# Patient Record
Sex: Male | Born: 1965 | Race: Black or African American | Hispanic: No | Marital: Married | Smoking: Never smoker
Health system: Southern US, Community
[De-identification: ages and names within clinical notes are randomized; demographics above are authoritative.]

## PROBLEM LIST (undated history)

## (undated) HISTORY — PX: INGUINAL HERNIA REPAIR: SHX194

---

## 2002-04-26 ENCOUNTER — Emergency Department (HOSPITAL_COMMUNITY): Admission: EM | Admit: 2002-04-26 | Discharge: 2002-04-26 | Payer: Self-pay | Admitting: Emergency Medicine

## 2002-04-26 ENCOUNTER — Encounter: Payer: Self-pay | Admitting: Emergency Medicine

## 2003-09-11 ENCOUNTER — Emergency Department (HOSPITAL_COMMUNITY): Admission: EM | Admit: 2003-09-11 | Discharge: 2003-09-11 | Payer: Self-pay | Admitting: Emergency Medicine

## 2005-04-09 IMAGING — CT CT HEAD W/O CM
1 of 2 series · 13 of 30 positions shown, 17 images · non-contrast
Comparison: to 04/26/02.

CLINICAL DATA: Syncope, headache, dizziness
CT HEAD W/O CONTRAST
TECHNIQUE: Multi-detector helical CT scanning is obtained from the skull base to the vertex.

[Series 2: brain · axial · 0.47mm/px · z∈[+139,+259]mm · 13 of 28 slices shown, 17 images]
[im 2/28  brain]
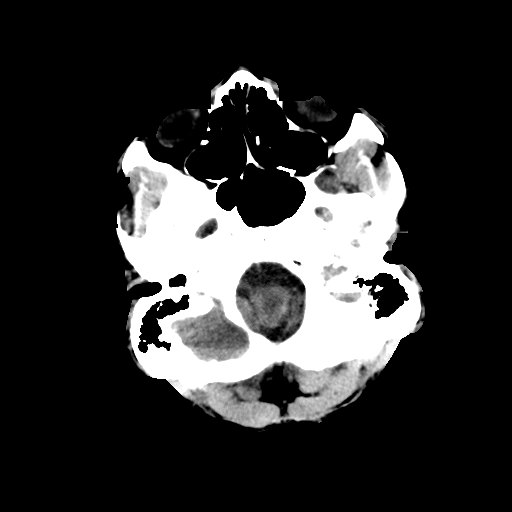
[im 2/28  bone]
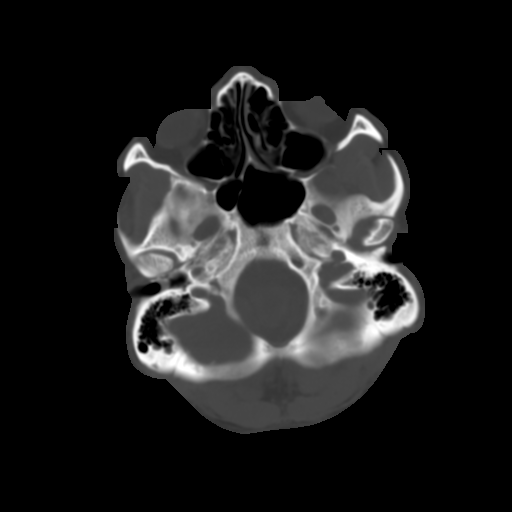
[im 4/28  brain]
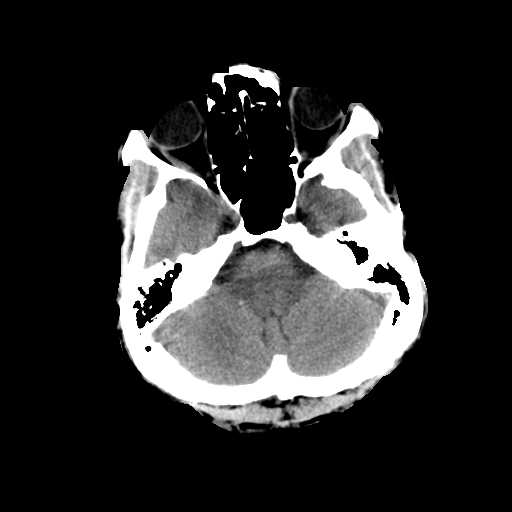
[im 6/28  brain]
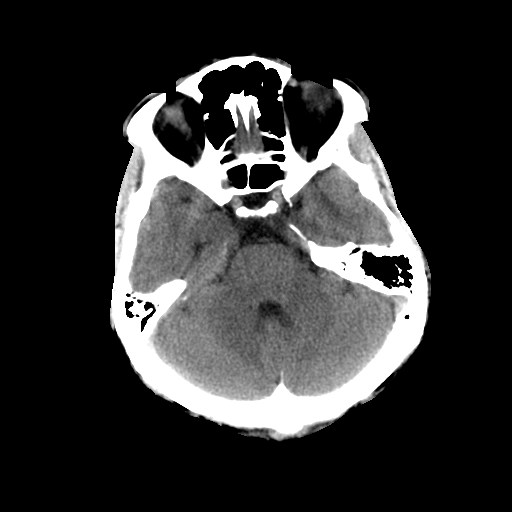
[im 8/28  brain]
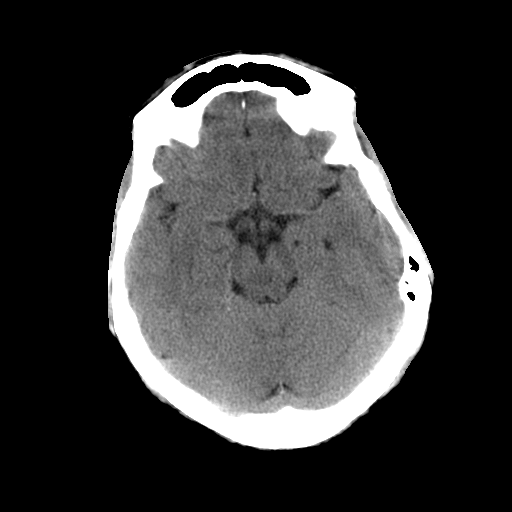
[im 10/28  brain]
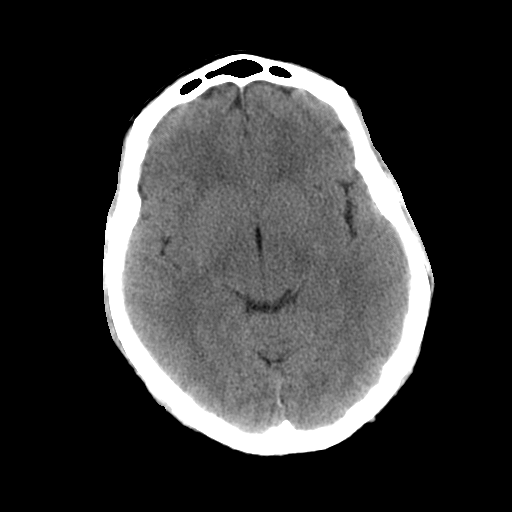
[im 10/28  bone]
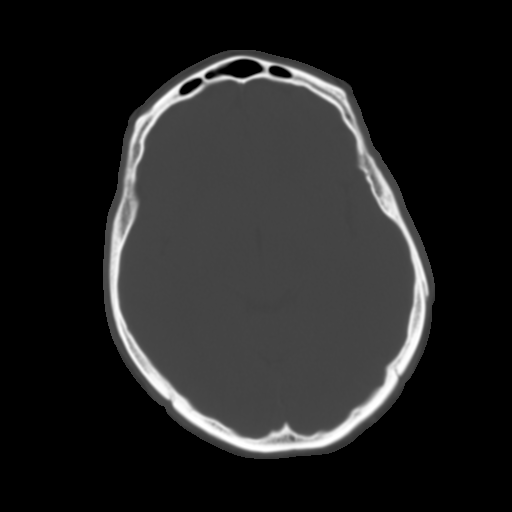
[im 12/28  brain]
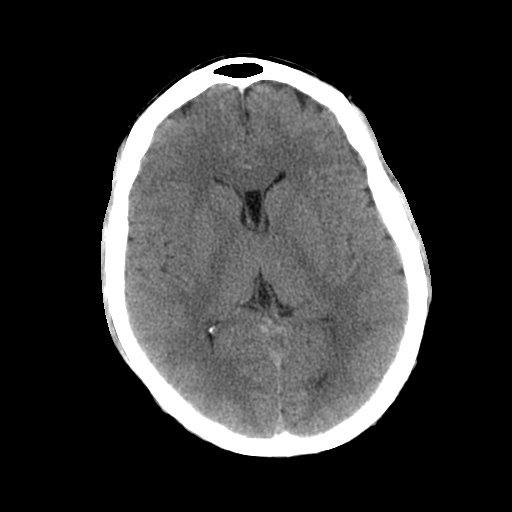
[im 14/28  brain]
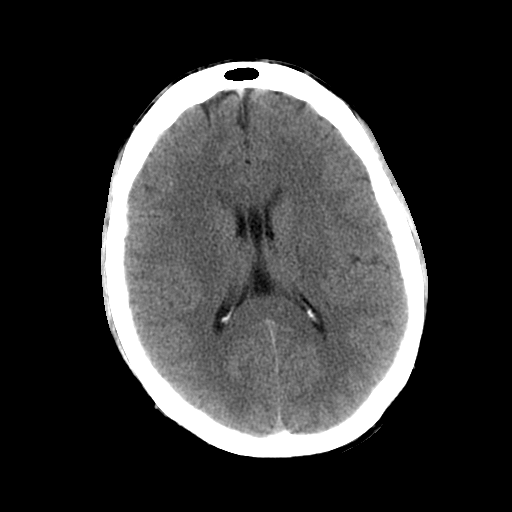
[im 16/28  brain]
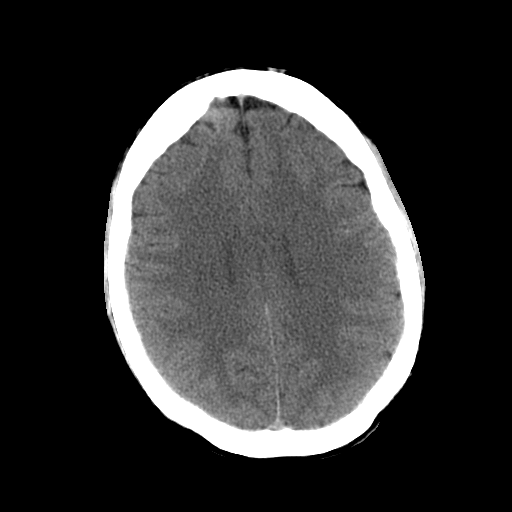
[im 18/28  brain]
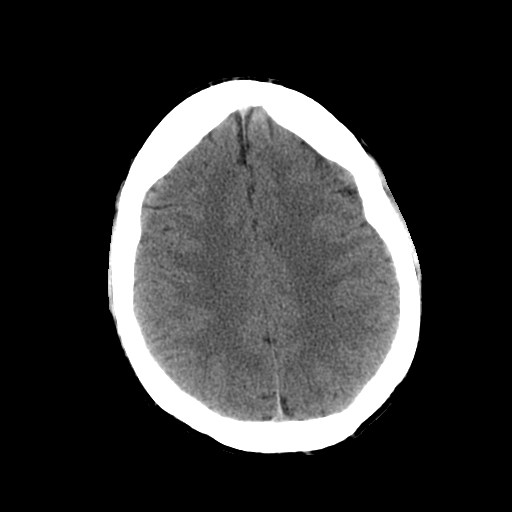
[im 18/28  bone]
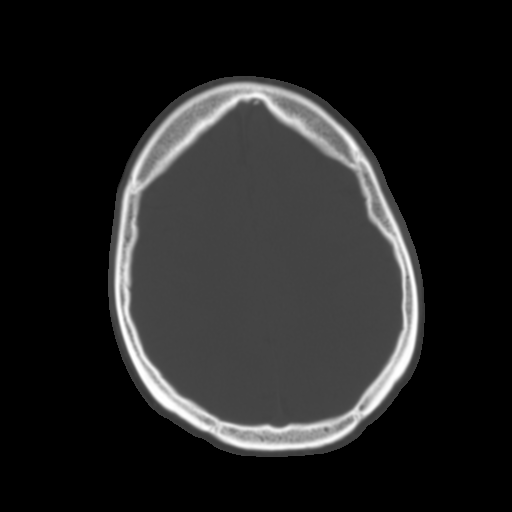
[im 20/28  brain]
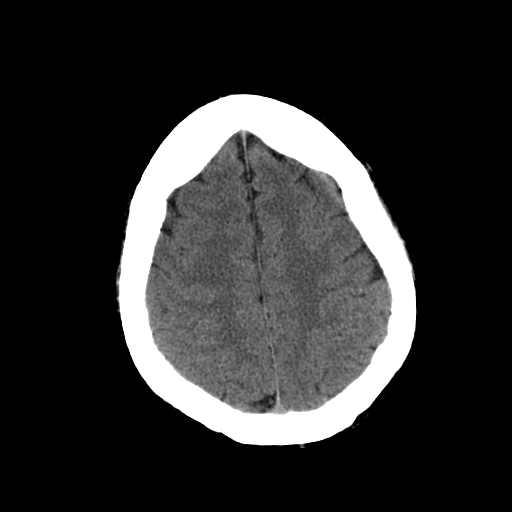
[im 22/28  brain]
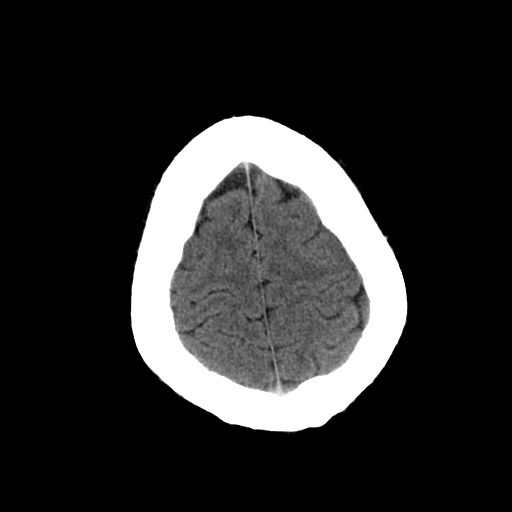
[im 24/28  brain]
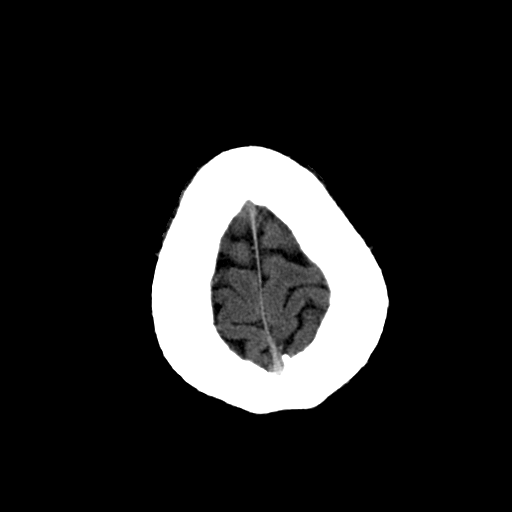
[im 26/28  brain]
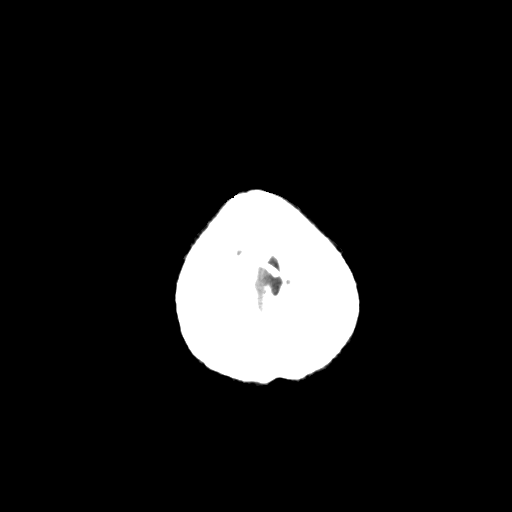
[im 26/28  bone]
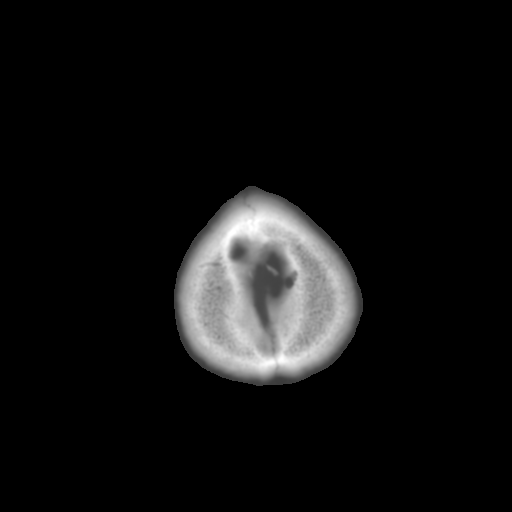

[13 of 30 positions shown; findings below may reference images not displayed]

FINDINGS: 
No acute intracranial abnormality identified including mass or mass effect, hydrocephalus, extra-axial fluid collection, midline shift, hemorrhage, infarct. Acute infarct may be missed by CT for 24 to 48 hours.  Cavum septum pellucidum et verge is noted. Visualized bony calvarium and paranasal sinuses are unremarkable.
IMPRESSION
No evidence of acute intracranial abnormality.

## 2014-12-14 ENCOUNTER — Emergency Department (HOSPITAL_COMMUNITY): Payer: Self-pay

## 2014-12-14 ENCOUNTER — Emergency Department (HOSPITAL_COMMUNITY)
Admission: EM | Admit: 2014-12-14 | Discharge: 2014-12-14 | Disposition: A | Discharge status: Home / Self Care | Payer: Self-pay | Attending: Emergency Medicine | Admitting: Emergency Medicine

## 2014-12-14 ENCOUNTER — Encounter (HOSPITAL_COMMUNITY): Payer: Self-pay | Admitting: Vascular Surgery

## 2014-12-14 DIAGNOSIS — R0789 Other chest pain: Secondary | ICD-10-CM | POA: Insufficient documentation

## 2014-12-14 LAB — BASIC METABOLIC PANEL
Anion gap: 7 (ref 5–15)
BUN: 13 mg/dL (ref 6–20)
CHLORIDE: 108 mmol/L (ref 101–111)
CO2: 24 mmol/L (ref 22–32)
Calcium: 9 mg/dL (ref 8.9–10.3)
Creatinine, Ser: 1.12 mg/dL (ref 0.61–1.24)
GFR calc non Af Amer: >60 mL/min (ref >60)
Glucose, Bld: 81 mg/dL (ref 65–99)
POTASSIUM: 3.8 mmol/L (ref 3.5–5.1)
SODIUM: 139 mmol/L (ref 135–145)

## 2014-12-14 LAB — CBC
HEMATOCRIT: 42.6 % (ref 39.0–52.0)
Hemoglobin: 13.9 g/dL (ref 13.0–17.0)
MCH: 29.4 pg (ref 26.0–34.0)
MCHC: 32.6 g/dL (ref 30.0–36.0)
MCV: 90.1 fL (ref 78.0–100.0)
PLATELETS: 261 10*3/uL (ref 150–400)
RBC: 4.73 MIL/uL (ref 4.22–5.81)
RDW: 13.4 % (ref 11.5–15.5)
WBC: 4.9 10*3/uL (ref 4.0–10.5)

## 2014-12-14 LAB — I-STAT TROPONIN, ED
TROPONIN I, POC: 0 ng/mL (ref 0.00–0.08)
Troponin i, poc: 0 ng/mL (ref 0.00–0.08)

## 2014-12-14 MED ORDER — IBUPROFEN 400 MG PO TABS
600.0000 mg | ORAL_TABLET | Freq: Once | ORAL | Status: AC
  Administered 2014-12-14: 600 mg via ORAL
  Filled 2014-12-14 (×2): qty 1

## 2014-12-14 MED ORDER — KETOROLAC TROMETHAMINE 30 MG/ML IJ SOLN: 30.0000 mg | Freq: Once | INTRAMUSCULAR | Status: DC

## 2014-12-14 NOTE — ED Notes (Signed)
Pt reports to the ED for eval of CP. Reports the symptoms have been intermittent x 3 days and today it increased in severity and it is now constant. He took some Aleve which helped for a little while but now the pain is coming back. Denies any associated symptoms such as SOB, N/V, lightheadedness, dizziness, or diaphoresis. Denies any aggravating factors. Pt A&Ox4, resp e/u, and skin warm and dry.

## 2014-12-14 NOTE — ED Provider Notes (Signed)
CSN: [id number]     Arrival date & time 12/14/14  1449 History   First MD Initiated Contact with Patient 12/14/14 1558     Chief Complaint  Patient presents with  . Chest Pain     (Consider location/radiation/quality/duration/timing/severity/associated sxs/prior Treatment) HPI  49 year old male who presents with chest pain. He is otherwise healthy, no prior tobacco use history. Reports intermittent chest pain since 3 days ago, that was worse today and constant starting this morning. Pain describes as sharp and aching over the left side of his chest. Pain is non-radiating. Reports maybe lifting a few boxes a few days ago, noticed a twinge of pain over his left side of the chest. Initially took Aleve, which helped control pain, but now feels that pain is worse. Denies any associating diaphoresis, nausea, vomiting, shortness of breath, lightheadedness, or syncope. Denies that pain is worse with exertion, but does note that it may sometimes worsen with movement. Denies pleuritic nature to pain, lower extremity swelling or pain, recent immobilization, personal or family history PE/DVT.  History reviewed. No pertinent past medical history. Past Surgical History  Procedure Laterality Date  . Inguinal hernia repair     History reviewed. No pertinent family history. Social History  Substance Use Topics  . Smoking status: Never Smoker   . Smokeless tobacco: Never Used  . Alcohol Use: No    Review of Systems 10/14 systems reviewed and are negative other than those stated in the HPI   Allergies  Review of patient's allergies indicates not on file.  Home Medications   Prior to Admission medications   Medication Sig Start Date End Date Taking? Authorizing Provider  naproxen sodium (ANAPROX) 220 MG tablet Take 220 mg by mouth daily as needed (for pain).   Yes Historical Provider, MD   BP 147/97 mmHg  Pulse 85  Temp(Src) 98.4 F (36.9 C) (Oral)  Resp 16  SpO2 99% Physical Exam Physical  Exam  Nursing note and vitals reviewed. Constitutional: Well developed, well nourished, non-toxic, and in no acute distress Head: Normocephalic and atraumatic.  Mouth/Throat: Oropharynx is clear and moist.  Neck: Normal range of motion. Neck supple.  Cardiovascular: Normal rate and regular rhythm.   no lower extremity edema. Pulmonary/Chest: Effort normal and breath sounds normal.  tenderness to palpation over the left chest wall, lateral to the sternum. Abdominal: Soft. There is no tenderness. There is no rebound and no guarding.  Musculoskeletal: Normal range of motion.  Neurological: Alert, no facial droop, fluent speech, moves all extremities symmetrically Skin: Skin is warm and dry.  Psychiatric: Cooperative  ED Course  Procedures (including critical care time) Labs Review Labs Reviewed  BASIC METABOLIC PANEL  CBC  I-STAT TROPOININ, ED  Rosezena Sensor, ED    Imaging Review Dg Chest 2 View  12/14/2014   CLINICAL DATA:  Chest pain for 10 days.  Worse today  EXAM: CHEST  2 VIEW  COMPARISON:  None.  FINDINGS: Normal cardiac silhouette. Sigmoid scoliosis of the thoracic spine. No effusion, infiltrate, or pneumothorax.  IMPRESSION: 1.  No acute cardiopulmonary process. 2. Sigmoid scoliosis of the spine.   Electronically Signed   By: Genevive Bi M.D.   On: 12/14/2014 15:34   I have personally reviewed and evaluated these images and lab results as part of my medical decision-making.   EKG Interpretation   Date/Time:  Monday December 14 2014 14:55:48 EDT Ventricular Rate:  85 PR Interval:  124 QRS Duration: 84 QT Interval:  352 QTC Calculation: 418  R Axis:   53 Text Interpretation:  Normal sinus rhythm Normal ECG Confirmed by Fayrene Fearing   MD, MARK ([id number]) on 12/14/2014 3:30:44 PM      MDM   Final diagnoses:  Other chest pain  Chest wall pain    In short, this is a 49 year old male, otherwise healthy, who presents with chest pain. He is well-appearing, nontoxic, in no  acute distress on presentation. Vital signs are within normal limits. EKG is nonischemic. Chest x-ray shows no acute cardiopulmonary processes, and troponin 1 is negative. Pain is reproducible over the left chest wall area, and pain, consistent with more likely musculoskeletal type pain. He has no major risk factors for ACS, and presentation is not suspicious just about such. HEART score of 1, and low risk of MACE. He is PERC negative, and presentation not consistent with that of PE. At this time, low suspicion for serious or toxic etiology of symptoms.  Patient is given reassurance, and given ibuprofen for pain control in the emergency department, with improvement in symptoms. He  will closely follow up with his primary care doctor for ongoing management. Strict return and follow-up instructions reviewed. He expressed understanding of all discharge instructions and felt comfortable to plan of care.    Lavera Guise, MD 12/14/14 [id number]

## 2014-12-14 NOTE — Discharge Instructions (Signed)
Please continue to take ibuprofen 600 mg every 6 hours as needed for pain control. Take with food and crackers as it may upset her stomach. Return without fail for worsening symptoms including worsening pain, difficulty breathing, passing out, or any other symptoms concerning to you. Please follow-up with your primary care doctor to make sure that symptoms are improving.  Chest Wall Pain Chest wall pain is pain in or around the bones and muscles of your chest. It may take up to 6 weeks to get better. It may take longer if you must stay physically active in your work and activities.  CAUSES  Chest wall pain may happen on its own. However, it may be caused by:  A viral illness like the flu.  Injury.  Coughing.  Exercise.  Arthritis.  Fibromyalgia.  Shingles. HOME CARE INSTRUCTIONS   Avoid overtiring physical activity. Try not to strain or perform activities that cause pain. This includes any activities using your chest or your abdominal and side muscles, especially if heavy weights are used.  Put ice on the sore area.  Put ice in a plastic bag.  Place a towel between your skin and the bag.  Leave the ice on for 15-20 minutes per hour while awake for the first 2 days.  Only take over-the-counter or prescription medicines for pain, discomfort, or fever as directed by your caregiver. SEEK IMMEDIATE MEDICAL CARE IF:   Your pain increases, or you are very uncomfortable.  You have a fever.  Your chest pain becomes worse.  You have new, unexplained symptoms.  You have nausea or vomiting.  You feel sweaty or lightheaded.  You have a cough with phlegm (sputum), or you cough up blood. MAKE SURE YOU:   Understand these instructions.  Will watch your condition.  Will get help right away if you are not doing well or get worse. Document Released: 03/27/2005 Document Revised: 06/19/2011 Document Reviewed: 11/21/2010 [facility] Patient Information 2015 [place], [place]. This  information is not intended to replace advice given to you by your health care provider. Make sure you discuss any questions you have with your health care provider.

## 2016-07-12 IMAGING — DX DG CHEST 2V
2 series · 2 of 2 positions shown · non-contrast
Comparison: None.

CLINICAL DATA: Chest pain for 10 days.  Worse today

EXAM:
CHEST  2 VIEW

[w chest pa]
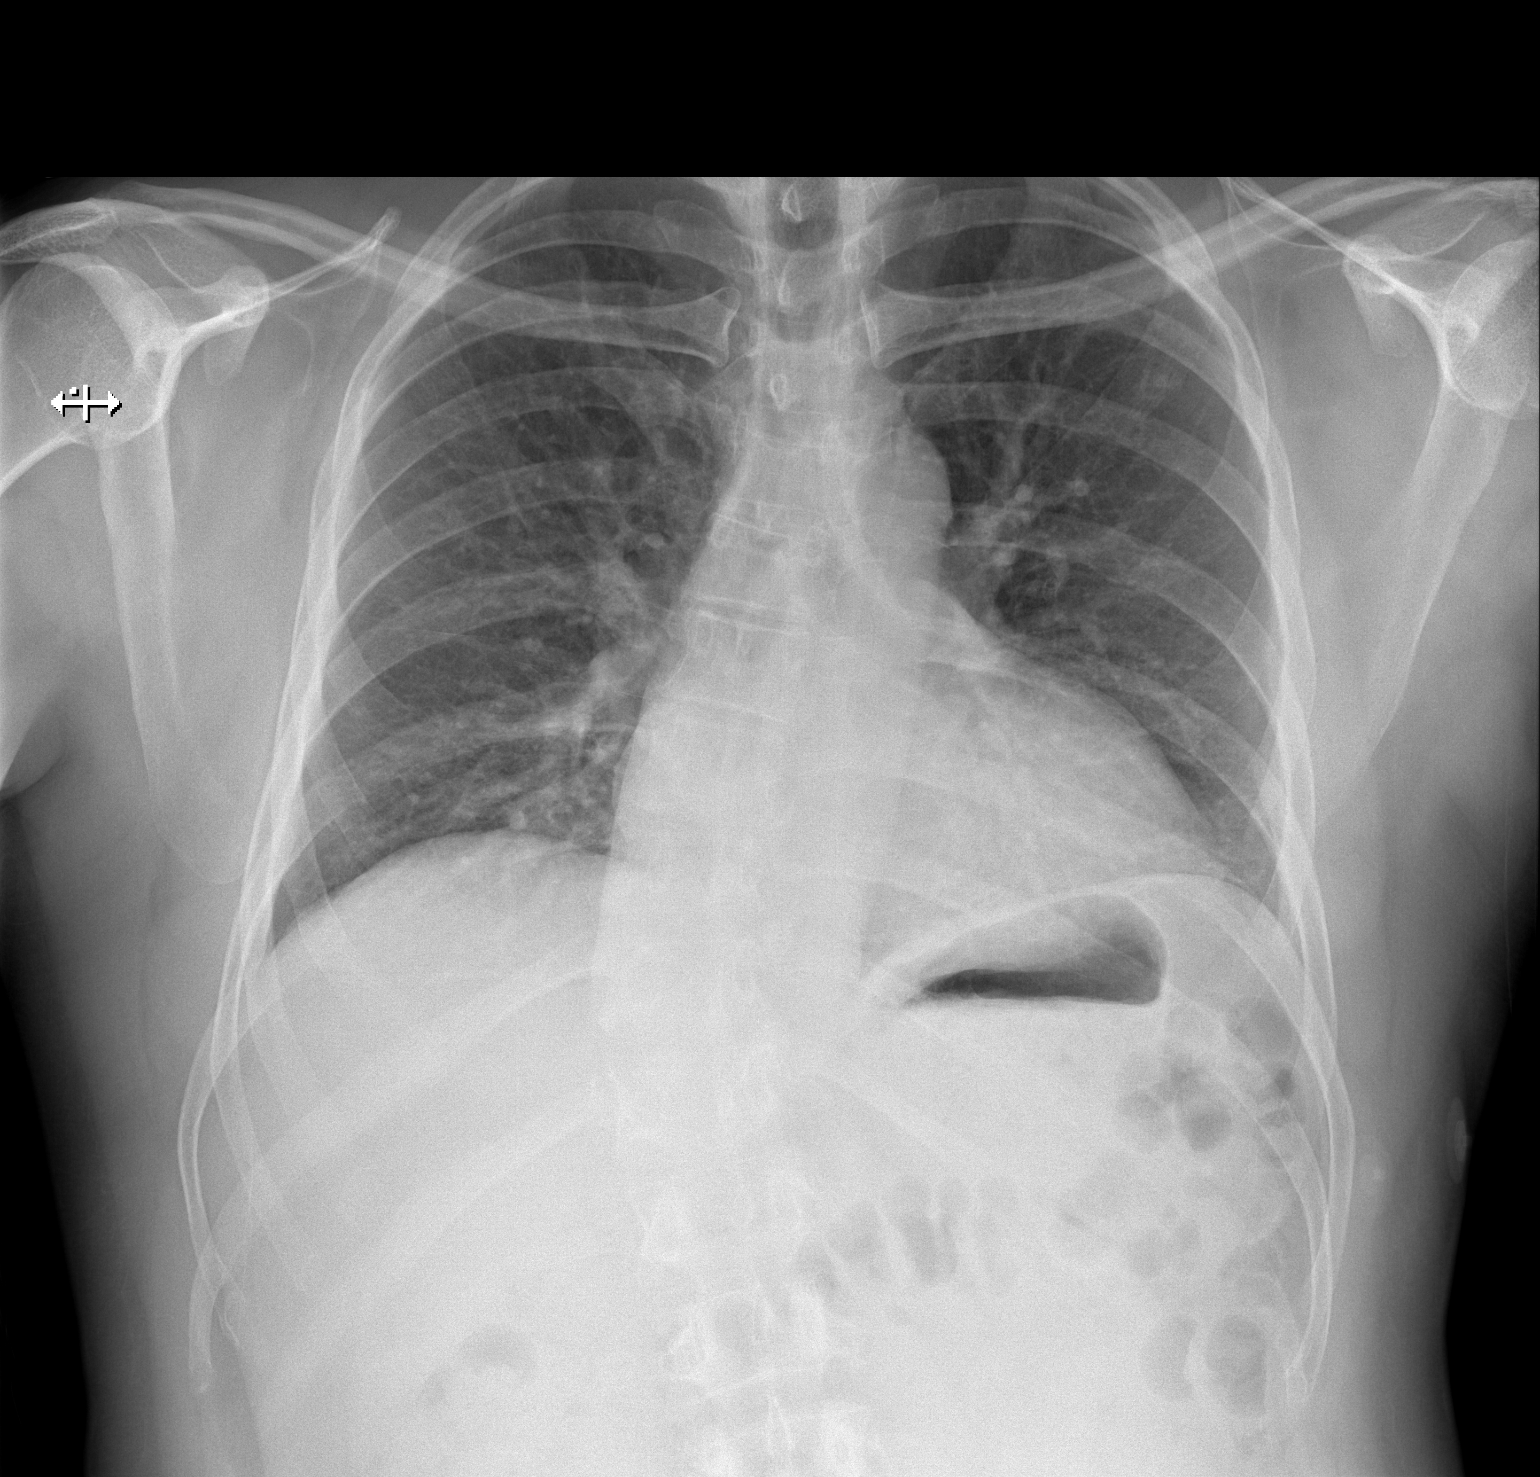

[w chest lat]
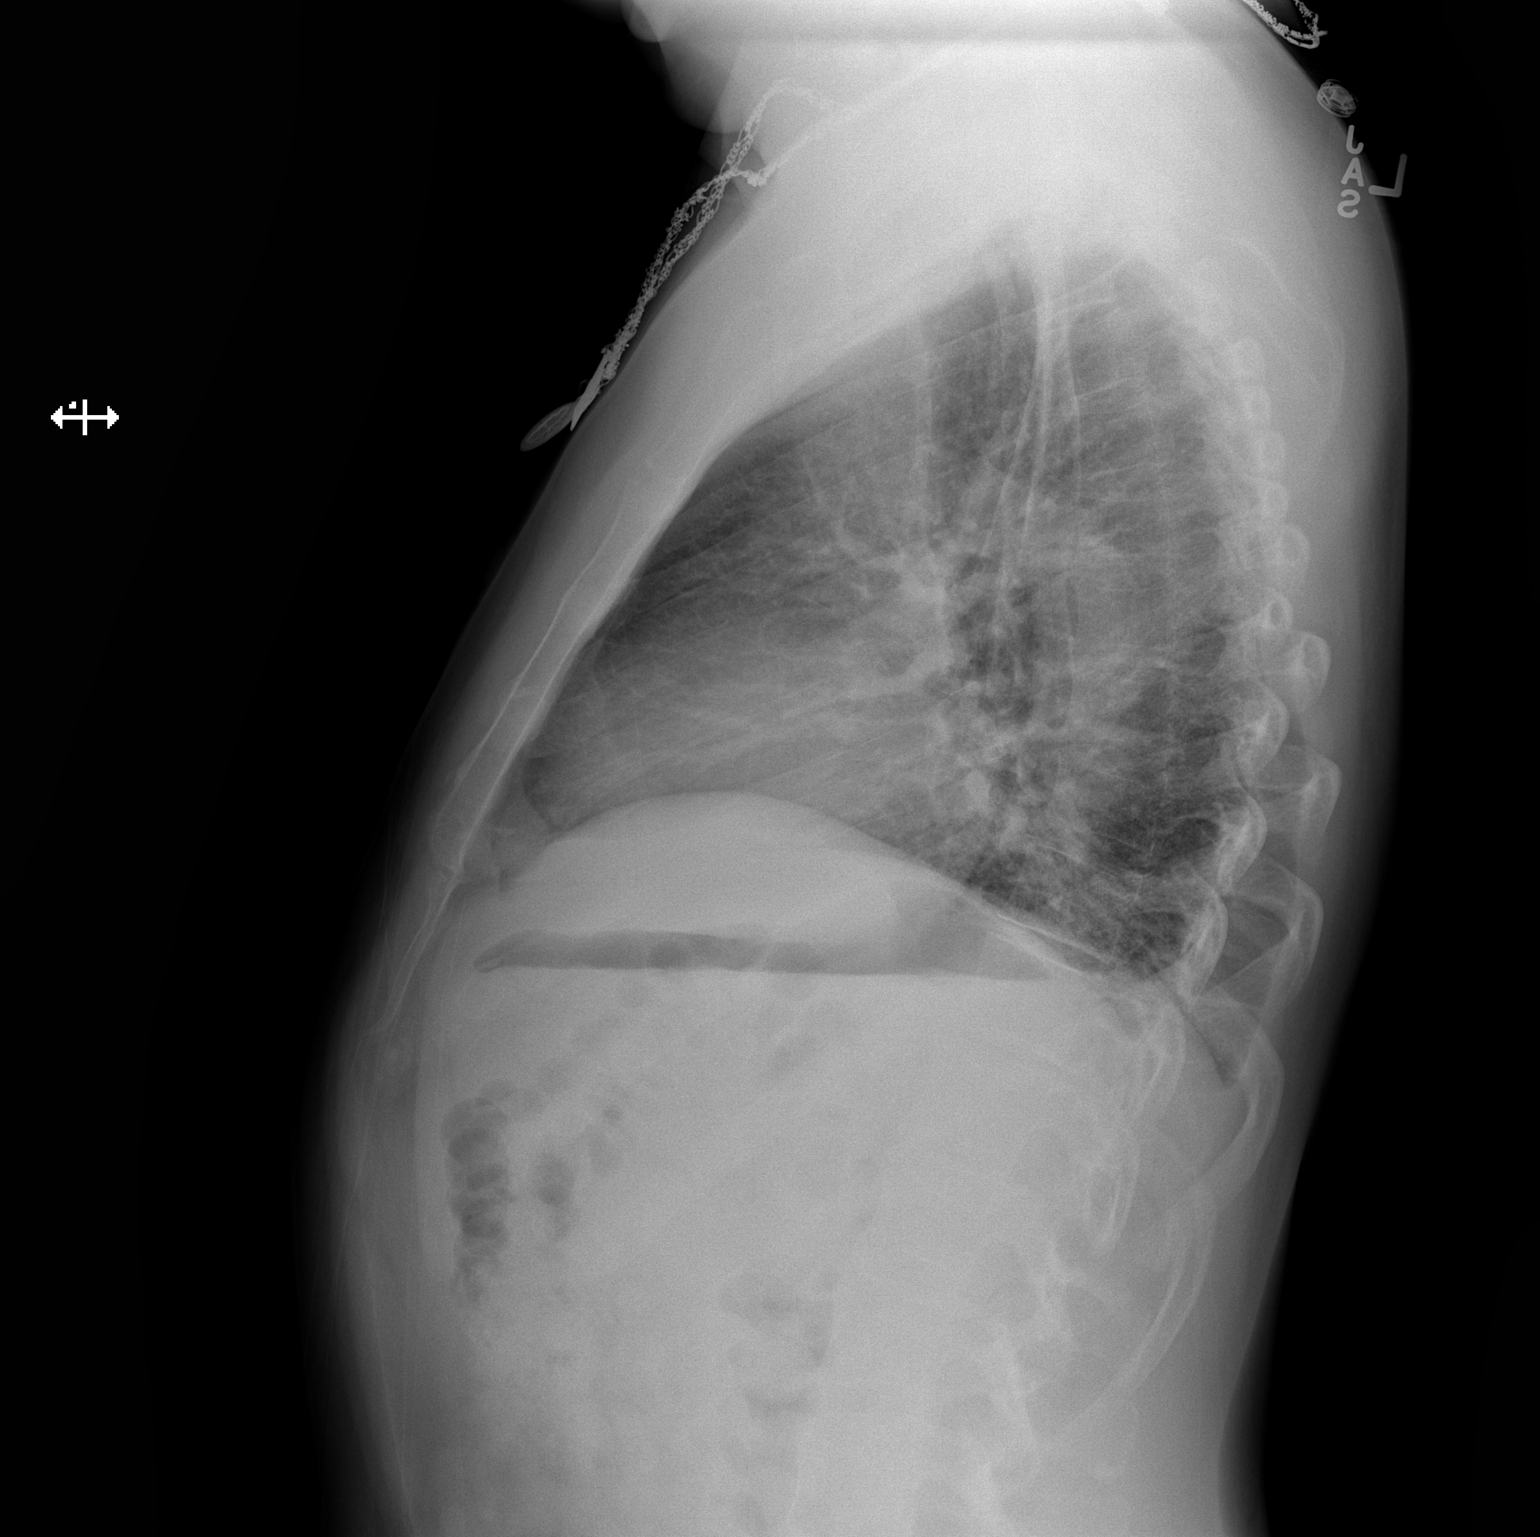

[2 of 2 positions shown; findings below may reference images not displayed]

FINDINGS: Normal cardiac silhouette. Sigmoid scoliosis of the thoracic spine.
No effusion, infiltrate, or pneumothorax.
IMPRESSION: 1.  No acute cardiopulmonary process.
2. Sigmoid scoliosis of the spine.

## 2018-12-04 ENCOUNTER — Other Ambulatory Visit: Payer: Self-pay | Admitting: *Deleted

## 2018-12-04 DIAGNOSIS — Z20822 Contact with and (suspected) exposure to covid-19: Secondary | ICD-10-CM

## 2018-12-05 LAB — NOVEL CORONAVIRUS, NAA: SARS-CoV-2, NAA: DETECTED — AB
